# Patient Record
Sex: Female | Born: 2000 | Race: White | Hispanic: No | Marital: Single | Smoking: Never smoker
Health system: Southern US, Community
[De-identification: ages and names within clinical notes are randomized; demographics above are authoritative.]

## PROBLEM LIST (undated history)

## (undated) DIAGNOSIS — F32A Depression, unspecified: Secondary | ICD-10-CM

## (undated) DIAGNOSIS — F329 Major depressive disorder, single episode, unspecified: Secondary | ICD-10-CM

## (undated) DIAGNOSIS — F419 Anxiety disorder, unspecified: Secondary | ICD-10-CM

## (undated) HISTORY — DX: Depression, unspecified: F32.A

## (undated) HISTORY — PX: WISDOM TOOTH EXTRACTION: SHX21

## (undated) HISTORY — PX: TYMPANOSTOMY TUBE PLACEMENT: SHX32

## (undated) HISTORY — DX: Anxiety disorder, unspecified: F41.9

---
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
## 2020-03-23 ENCOUNTER — Ambulatory Visit: Payer: PRIVATE HEALTH INSURANCE | Attending: Internal Medicine

## 2020-03-23 DIAGNOSIS — Z23 Encounter for immunization: Secondary | ICD-10-CM

## 2020-03-23 NOTE — Progress Notes (Signed)
   Covid-19 Vaccination Clinic  Name:  Anna Yang    MRN: [id number] DOB: 06/24/2000  03/23/2020  [name] was observed post Covid-19 immunization for 15 minutes without incident. She was provided with Vaccine Information Sheet and instruction to access the V-Safe system.   [name] was instructed to call 911 with any severe reactions post vaccine: Marland Kitchen Difficulty breathing  . Swelling of face and throat  . A fast heartbeat  . A bad rash all over body  . Dizziness and weakness   Immunizations Administered    Name Date Dose VIS Date Route   Pfizer COVID-19 Vaccine 03/23/2020  2:27 PM 0.3 mL 01/21/2020 Intramuscular   Manufacturer: [employer], [employer]   Lot: [id number]   NDC: [id number]

## 2020-06-28 IMAGING — CR DG CHEST 2V
2 series · 2 of 2 positions shown · non-contrast
Comparison: January 28, 2004.

CLINICAL DATA: Cough and shortness of breath

EXAM:
CHEST - 2 VIEW

[w chest pa]
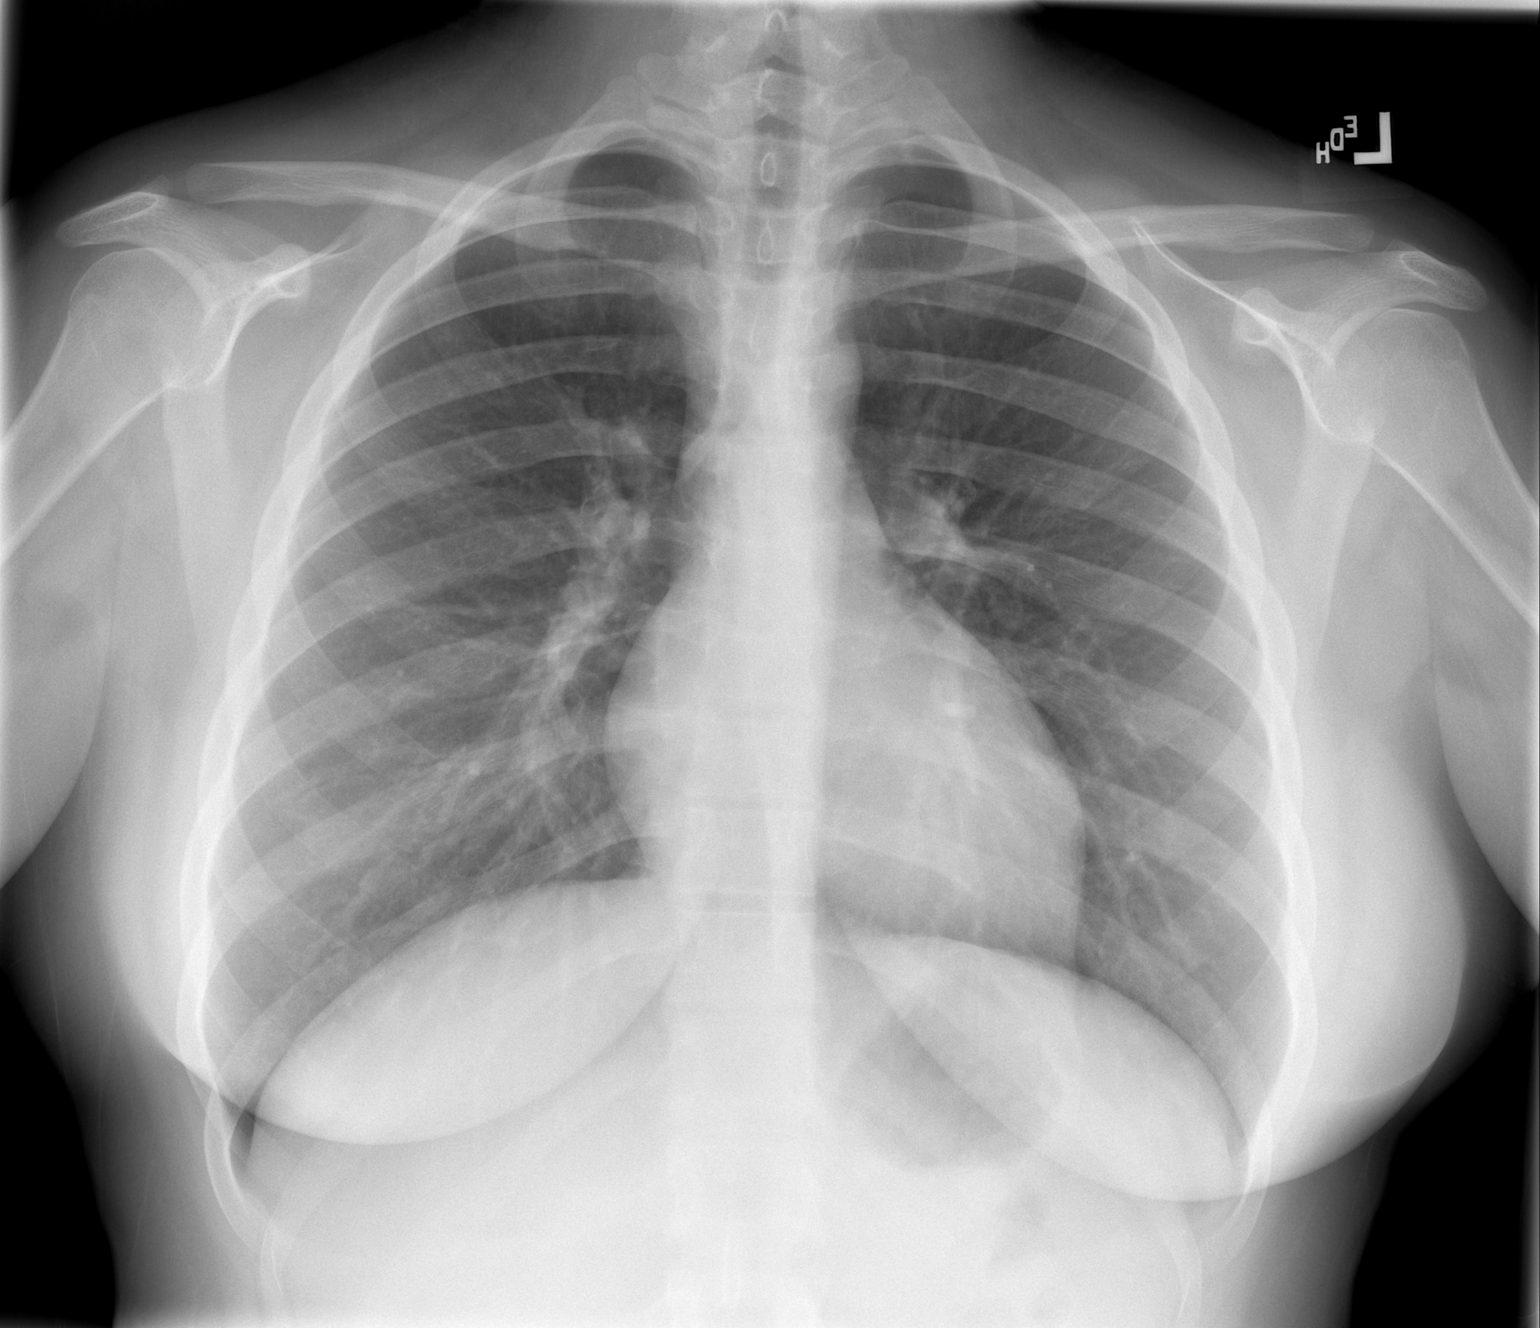

[w chest lat]
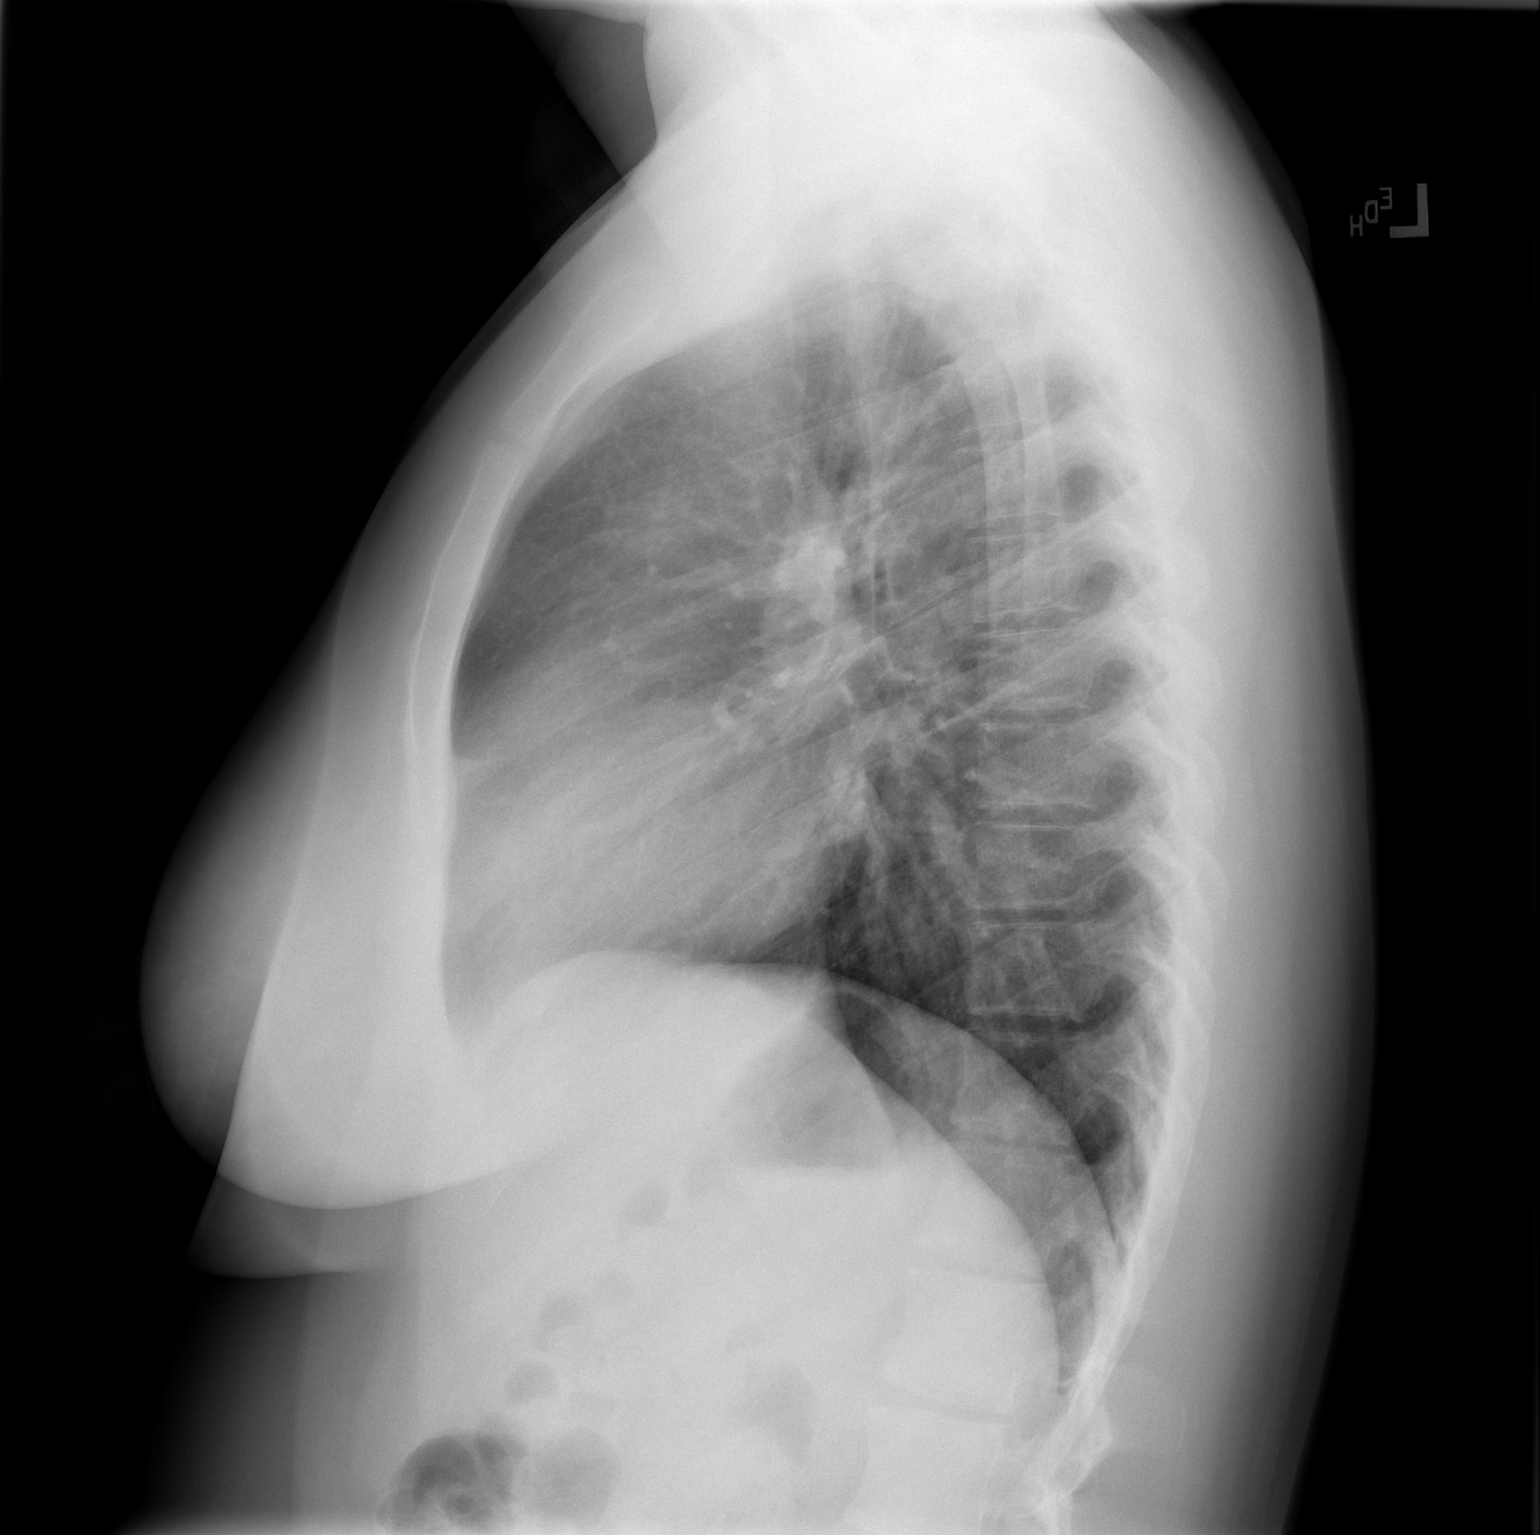

[2 of 2 positions shown; findings below may reference images not displayed]

FINDINGS: Lungs are clear. Heart size and pulmonary vascularity are normal. No
adenopathy. No bone lesions.
IMPRESSION: No edema or consolidation.
